# Patient Record
Sex: Male | Born: 1970 | Hispanic: No | Marital: Married | Smoking: Never smoker
Health system: Southern US, Community
[De-identification: ages and names within clinical notes are randomized; demographics above are authoritative.]

## PROBLEM LIST (undated history)

## (undated) DIAGNOSIS — K9049 Malabsorption due to intolerance, not elsewhere classified: Secondary | ICD-10-CM

## (undated) DIAGNOSIS — K219 Gastro-esophageal reflux disease without esophagitis: Secondary | ICD-10-CM

## (undated) DIAGNOSIS — K529 Noninfective gastroenteritis and colitis, unspecified: Secondary | ICD-10-CM

## (undated) DIAGNOSIS — Q760 Spina bifida occulta: Secondary | ICD-10-CM

## (undated) HISTORY — PX: OTHER SURGICAL HISTORY: SHX169

## (undated) HISTORY — DX: Noninfective gastroenteritis and colitis, unspecified: K52.9

## (undated) HISTORY — DX: Gastro-esophageal reflux disease without esophagitis: K21.9

## (undated) HISTORY — DX: Malabsorption due to intolerance, not elsewhere classified: K90.49

## (undated) HISTORY — DX: Spina bifida occulta: Q76.0

---

## 2013-01-29 ENCOUNTER — Other Ambulatory Visit: Payer: Self-pay | Admitting: Family Medicine

## 2013-01-29 DIAGNOSIS — R1031 Right lower quadrant pain: Secondary | ICD-10-CM

## 2013-01-31 ENCOUNTER — Other Ambulatory Visit: Payer: Self-pay | Admitting: Family Medicine

## 2013-01-31 DIAGNOSIS — R1031 Right lower quadrant pain: Secondary | ICD-10-CM

## 2013-02-01 ENCOUNTER — Ambulatory Visit
Admission: RE | Admit: 2013-02-01 | Discharge: 2013-02-01 | Disposition: A | Discharge status: Home / Self Care | Payer: 59 | Source: Ambulatory Visit | Attending: Family Medicine | Admitting: Family Medicine

## 2013-02-01 DIAGNOSIS — R1031 Right lower quadrant pain: Secondary | ICD-10-CM

## 2013-05-21 ENCOUNTER — Encounter: Payer: Self-pay | Admitting: Internal Medicine

## 2013-06-27 ENCOUNTER — Ambulatory Visit: Payer: 59 | Admitting: Internal Medicine

## 2013-07-30 ENCOUNTER — Encounter: Payer: Self-pay | Admitting: Gastroenterology

## 2013-08-07 ENCOUNTER — Encounter: Payer: Self-pay | Admitting: Gastroenterology

## 2013-08-07 ENCOUNTER — Ambulatory Visit (INDEPENDENT_AMBULATORY_CARE_PROVIDER_SITE_OTHER): Payer: 59 | Admitting: Gastroenterology

## 2013-08-07 VITALS — BP 110/70 | HR 80 | Ht 70.0 in | Wt 184.4 lb

## 2013-08-07 DIAGNOSIS — R1031 Right lower quadrant pain: Secondary | ICD-10-CM

## 2013-08-07 DIAGNOSIS — K219 Gastro-esophageal reflux disease without esophagitis: Secondary | ICD-10-CM

## 2013-08-07 NOTE — Progress Notes (Signed)
08/07/2013 Danny Reeves [id number] [birth year]   HISTORY OF PRESENT ILLNESS:  This is a 42 year old male who is new to our practice. He comes in today with several complaints and requests, but primarily is here for evaluation of his right lower quadrant abdominal pain. He says that the pain has been present since March or April and comes and goes but is present about every other day. It is described as a dull pain.  He says that it may increase after eating but does not seem to be affected by bowel movements. He has regular bowel movements with no blood. Gets occasional diarrhea if he eats greasy foods. He says that he was checked for lactose and fructose intolerance by his previous family doctor and those were both positive. His PCP ordered an ultrasound of the abdomen for evaluation of this pain back in April, which was negative. He then ordered a CT scan of the abdomen and pelvis, however, the patient did not have that study performed because he is concerned about the radiation. I mentioned to him again about CAT scan today, but he is adamant that we attempt to get an MRI instead.  He also complains of reflux type symptoms that have only been present for the past week. He is concerned about a hiatal hernia and does not want to have to take medication. He was advised that a hiatal hernia should show up on abdominal imaging.    Past Medical History  Diagnosis Date  . GERD (gastroesophageal reflux disease)   . Food intolerance     dairy,frutose   History reviewed. No pertinent past surgical history.  reports that he has never smoked. He has never used smokeless tobacco. He reports that he drinks alcohol. He reports that he does not use illicit drugs. family history includes Heart disease in his mother; Liver cancer in his father. No Known Allergies    No outpatient encounter prescriptions on file as of 08/07/2013.   No facility-administered encounter medications on file as of 08/07/2013.      REVIEW OF SYSTEMS  : All other systems reviewed and negative except where noted in the History of Present Illness.   PHYSICAL EXAM: BP 110/70  Pulse 80  Ht 5\' 10"  (1.778 m)  Wt 184 lb 6 oz (83.632 kg)  BMI 26.46 kg/m2 General: Well developed male in no acute distress Head: Normocephalic and atraumatic Eyes:  Sclerae anicteric, conjunctiva pink. Ears: Normal auditory acuity  Lungs: Clear throughout to auscultation Heart: Regular rate and rhythm Abdomen: Soft, nontender, nondistended.  Normal bowel sounds. Small reducible non-tender umbilical hernia noted.   Musculoskeletal: Symmetrical with no gross deformities  Skin: No lesions on visible extremities Extremities: No edema  Neurological: Alert oriented x 4, grossly nonfocal Psychological:  Alert and cooperative. Normal mood and affect  ASSESSMENT AND PLAN: -Right lower quadrant abdominal pain:  Unsure of the source of his pain. I suggested a CT scan since the pain has now been present for 7 month, but patient declined and is concerned about the radiation. He was very adamant that we order an MRI instead. I told him that if the MRI was not approved then CAT scan would be the next step. -Reflux:  Only present for approximately one week. Will try over-the-counter Zantac once or twice daily.

## 2013-08-07 NOTE — Patient Instructions (Signed)
You have been scheduled for an MRI at [facility] on 08/19/13. Your appointment time is 8:00 am. Please arrive 15 minutes prior to your appointment time for registration purposes. There is no prep for this test. However, if you have any metal in your body, have a pacemaker or defibrillator, please be sure to let your ordering physician know. This test typically takes 45 minutes to 1 hour to complete.

## 2013-08-08 NOTE — Progress Notes (Signed)
Reviewed and agree with management.  I would also check Hemoccults Apolonio Cutting D. Arlyce Dice, M.D., [facility]

## 2013-08-19 ENCOUNTER — Ambulatory Visit (HOSPITAL_COMMUNITY)
Admission: RE | Admit: 2013-08-19 | Discharge: 2013-08-19 | Disposition: A | Discharge status: Home / Self Care | Payer: 59 | Source: Ambulatory Visit | Attending: Gastroenterology | Admitting: Gastroenterology

## 2013-08-19 ENCOUNTER — Other Ambulatory Visit: Payer: Self-pay | Admitting: Gastroenterology

## 2013-08-19 ENCOUNTER — Ambulatory Visit (HOSPITAL_COMMUNITY): Admission: RE | Admit: 2013-08-19 | Payer: 59 | Source: Ambulatory Visit

## 2013-08-19 DIAGNOSIS — K5730 Diverticulosis of large intestine without perforation or abscess without bleeding: Secondary | ICD-10-CM | POA: Insufficient documentation

## 2013-08-19 DIAGNOSIS — R1031 Right lower quadrant pain: Secondary | ICD-10-CM

## 2013-08-19 MED ORDER — GADOBENATE DIMEGLUMINE 529 MG/ML IV SOLN
20.0000 mL | Freq: Once | INTRAVENOUS | Status: AC | PRN
  Administered 2013-08-19: 17 mL via INTRAVENOUS

## 2013-08-20 ENCOUNTER — Other Ambulatory Visit: Payer: Self-pay | Admitting: *Deleted

## 2013-08-20 DIAGNOSIS — R109 Unspecified abdominal pain: Secondary | ICD-10-CM

## 2013-08-27 ENCOUNTER — Ambulatory Visit: Payer: 59 | Admitting: Gastroenterology

## 2013-08-27 IMAGING — US US ABDOMEN COMPLETE
1 series · 14 of 25 positions shown · non-contrast
Comparison: None.

CLINICAL DATA: Right lower quadrant pain

COMPLETE ABDOMINAL ULTRASOUND

[Series 1: us abdomen complete · 0.33mm/px · 14 of 75 slices shown]
[im 1/75]
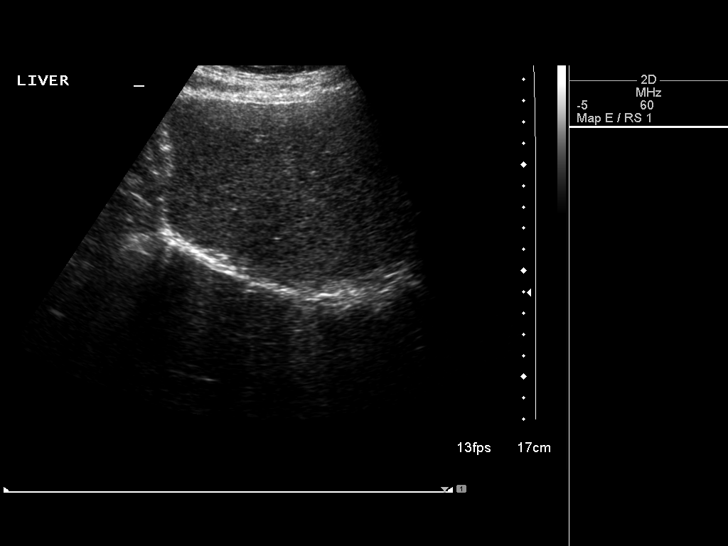
[im 7/75]
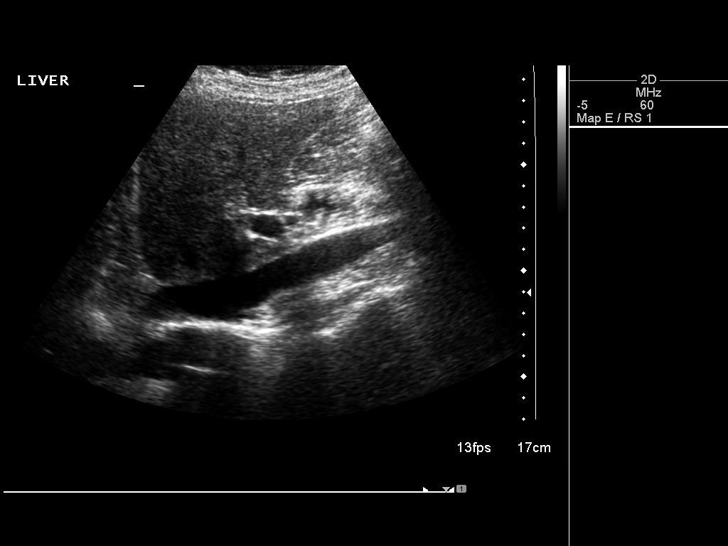
[im 13/75]
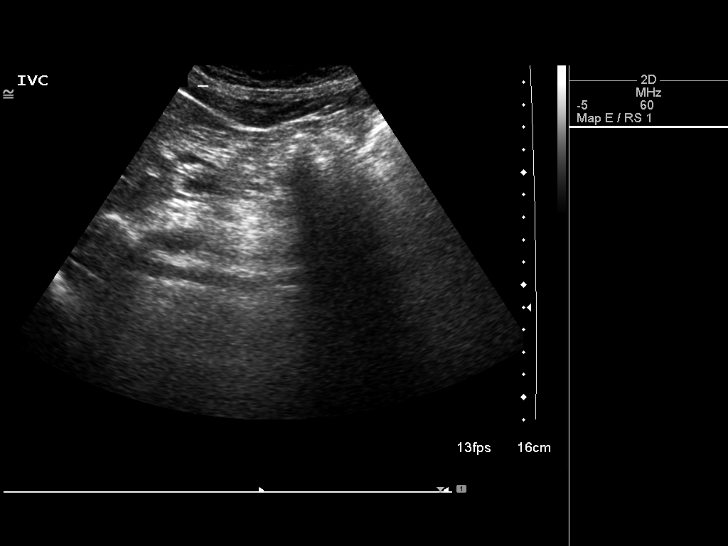
[im 19/75]
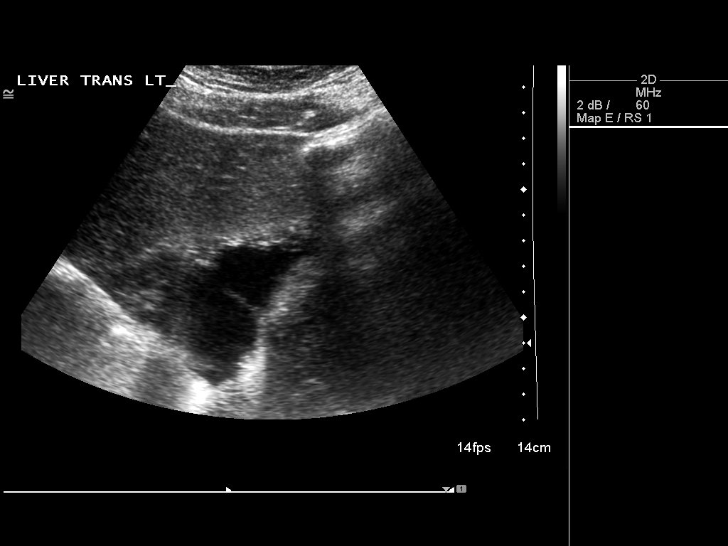
[im 25/75]
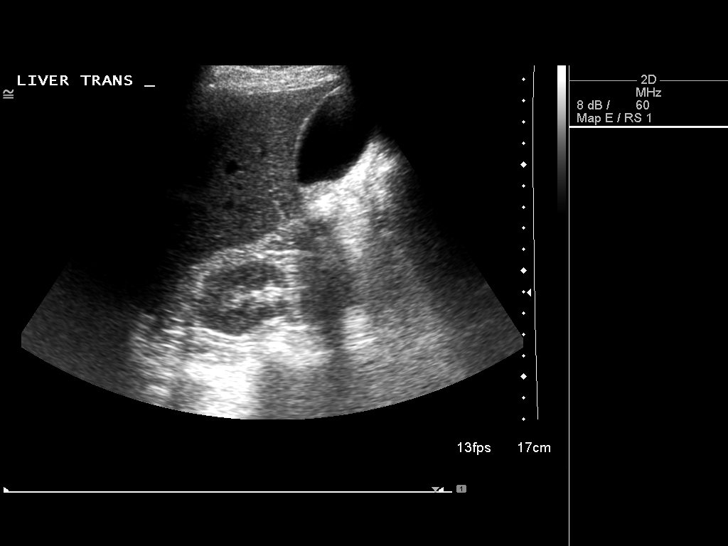
[im 28/75]
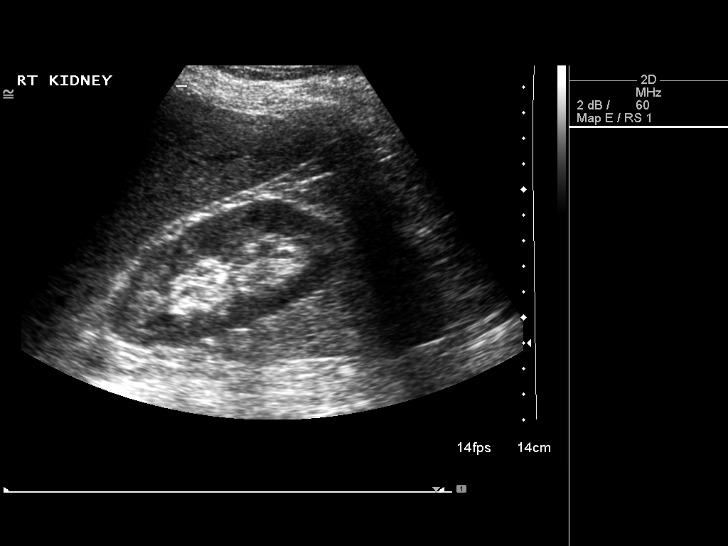
[im 34/75]
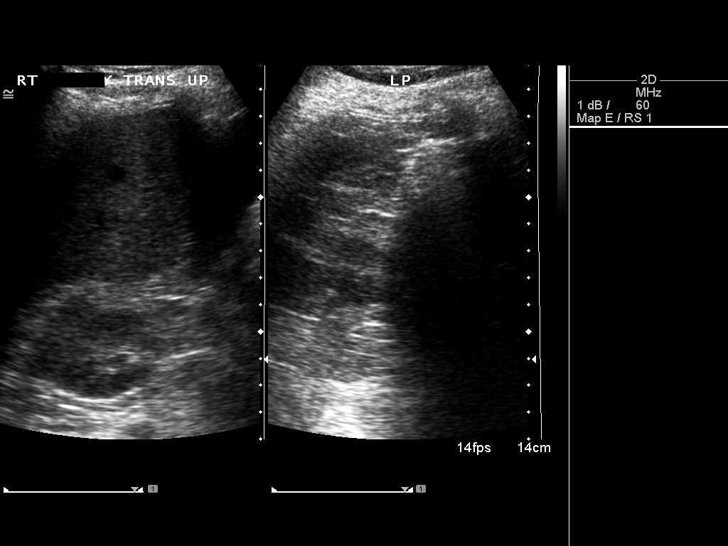
[im 41/75]
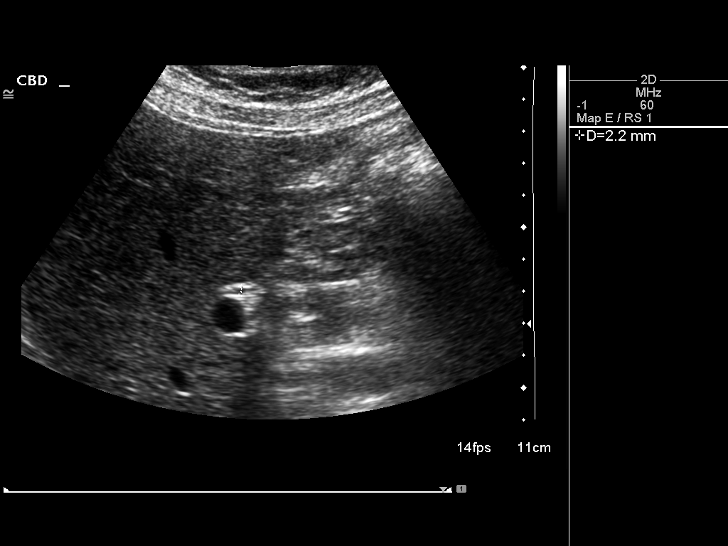
[im 47/75]
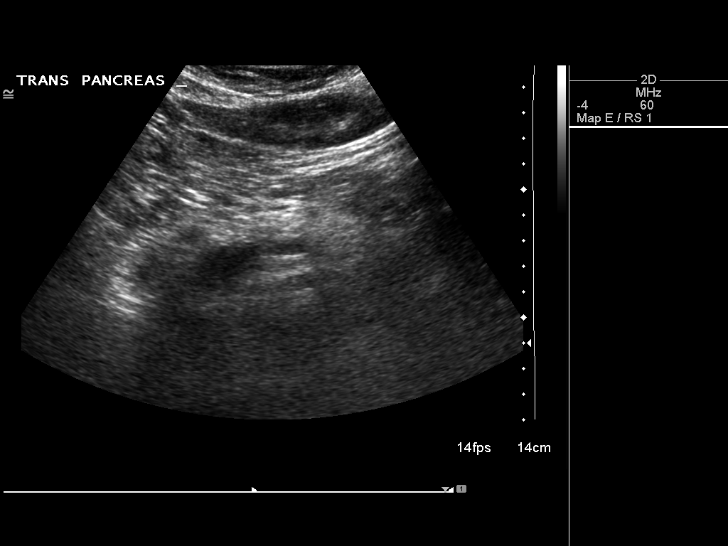
[im 50/75]
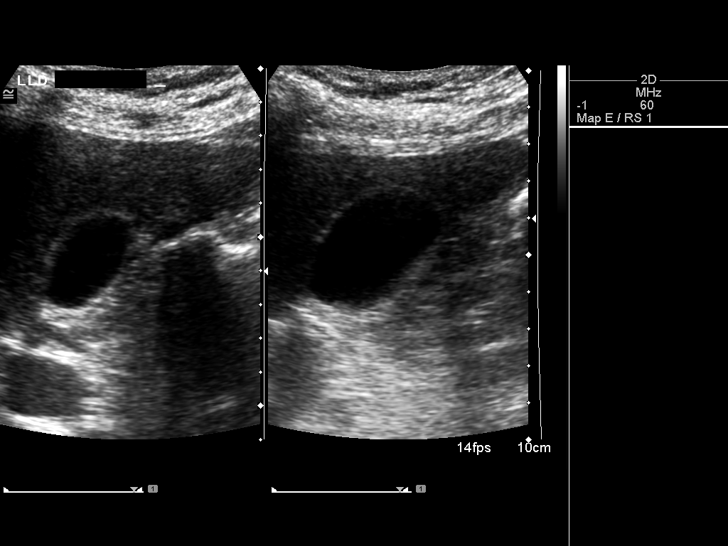
[im 56/75]
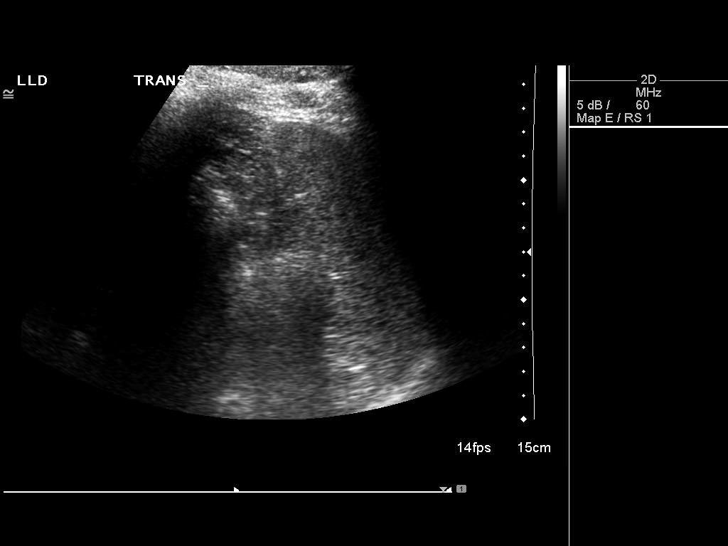
[im 62/75]
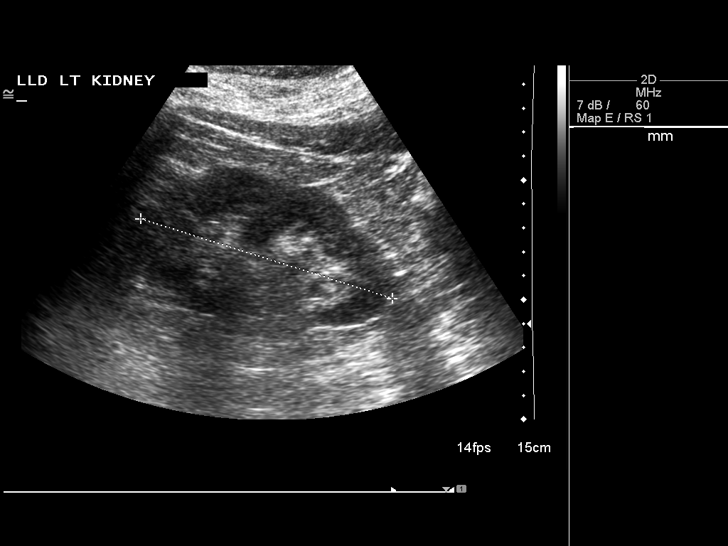
[im 68/75]
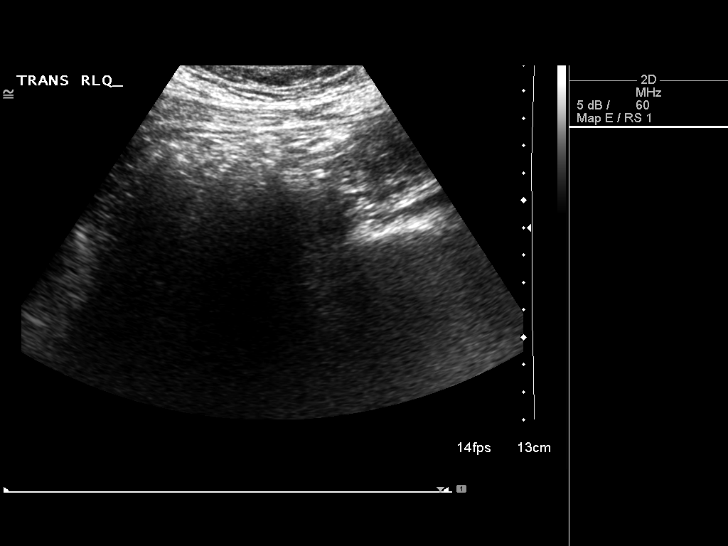
[im 75/75]
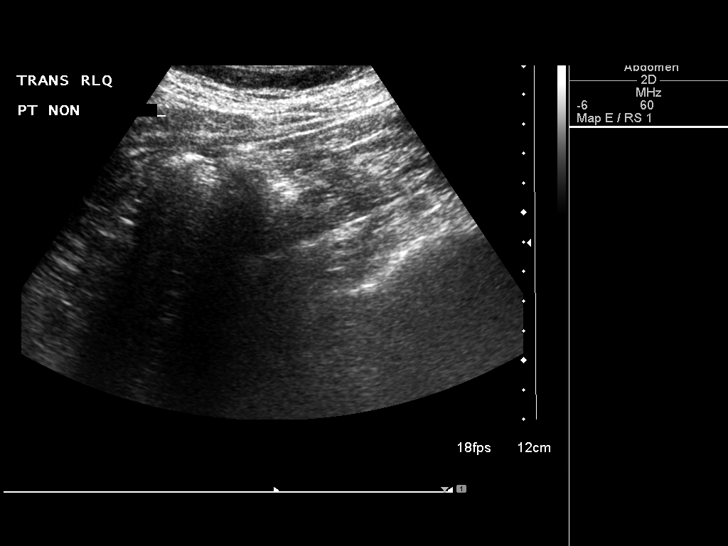

[14 of 25 positions shown; findings below may reference images not displayed]

FINDINGS: Gallbladder:  Bladder is visualized and no gallstones are noted.
There is no pain over the gallbladder with compression.

Common bile duct:  The common bile duct is normal measuring 2.2 mm
in diameter.

Liver:  The liver has a normal echogenic pattern.  No ductal
dilatation is seen.

IVC:  Appears normal.

Pancreas:  No focal abnormality seen.

Spleen:  The spleen is normal measuring 5.4 cm sagittally.

Right Kidney:  No hydronephrosis is seen.  The right kidney
measures 10.3 cm sagittally.

Left Kidney:  No hydronephrosis is noted.  The left kidney measures
11.0 cm.

Abdominal aorta:  The abdominal aorta is normal in caliber.

The evaluation of the appendix is not generally included in an
ultrasound of the abdomen.  There is a large amount of bowel gas
however in this patient's right lower quadrant and the appendix is
not visualized.
IMPRESSION: 1.  No gallstones.  No ductal dilatation.
2.  No hydronephrosis.
3.  Considerable bowel gas in the right lower quadrant obscuring
anatomic detail.

## 2013-09-18 ENCOUNTER — Telehealth: Payer: Self-pay | Admitting: *Deleted

## 2013-09-18 ENCOUNTER — Encounter: Payer: Self-pay | Admitting: *Deleted

## 2013-09-18 NOTE — Telephone Encounter (Signed)
Patient has not completed hemoccult cards as requested. Mailed a letter to patient requesting he do the testing or call us for new cards to be mailed to him.

## 2013-09-18 NOTE — Telephone Encounter (Signed)
Message copied by Daphine Deutscher on Wed Sep 18, 2013 11:02 AM ------      Message from: Daphine Deutscher      Created: Tue Aug 20, 2013 11:09 AM       Did patient do stool cards for JZ ------

## 2013-09-23 ENCOUNTER — Telehealth: Payer: Self-pay | Admitting: *Deleted

## 2013-09-23 NOTE — Telephone Encounter (Signed)
Patient states he found his stool cards and will do the test for [place]. He asked many questions about his MRI results and if it showed a hernia. Went over report multiple times with patient. He kept asking if it showed a hernia. Offered OV with Dr. Arlyce Dice to discuss but patient said he did not need an OV. He repeatedly asked for radiologist to read MRI again but refused to schedule OV to talk with Dr. Arlyce Dice.

## 2013-09-26 ENCOUNTER — Encounter (INDEPENDENT_AMBULATORY_CARE_PROVIDER_SITE_OTHER): Payer: Self-pay

## 2013-09-26 ENCOUNTER — Encounter (INDEPENDENT_AMBULATORY_CARE_PROVIDER_SITE_OTHER): Payer: Self-pay | Admitting: General Surgery

## 2013-09-26 ENCOUNTER — Ambulatory Visit (INDEPENDENT_AMBULATORY_CARE_PROVIDER_SITE_OTHER): Payer: 59 | Admitting: General Surgery

## 2013-09-26 VITALS — BP 126/84 | HR 70 | Temp 97.4°F | Resp 16 | Ht 70.0 in | Wt 189.2 lb

## 2013-09-26 DIAGNOSIS — R1031 Right lower quadrant pain: Secondary | ICD-10-CM

## 2013-09-26 NOTE — Progress Notes (Signed)
Subjective:     Patient ID: Danny Reeves, male   DOB: [birth year], 42 y.o.   MRN: [id number]  HPI The patient is a 42 year old male who is referred by Dr. Bosie Clos for an evaluation of a possible right inguinal hernia. Patient states he noticed some pain several weeks ago. He was concerned about a GI issue. He is worked up by Dr. Bosie Clos and states there is no issues GI wise he could have a hernia. The patient states the pain has gotten better.  The patient underwent a MRIscan which reveals no hernia. Patient was also concerned of possible hiatal hernia.  There was no hiatal hernia on MRIReview of Systems  Constitutional: Negative.   HENT: Negative.   Eyes: Negative.   Respiratory: Negative.   Cardiovascular: Negative.   Gastrointestinal: Negative.   Endocrine: Negative.   Neurological: Negative.        Objective:   Physical Exam  Constitutional: He is oriented to person, place, and time. He appears well-developed and well-nourished.  HENT:  Head: Normocephalic and atraumatic.  Eyes: Conjunctivae and EOM are normal. Pupils are equal, round, and reactive to light.  Neck: Normal range of motion. Neck supple.  Cardiovascular: Normal rate, regular rhythm and normal heart sounds.   Pulmonary/Chest: Effort normal and breath sounds normal.  Abdominal: Hernia confirmed negative in the right inguinal area and confirmed negative in the left inguinal area.    Musculoskeletal: Normal range of motion.  Neurological: He is alert and oriented to person, place, and time.  Skin: Skin is warm and dry.       Assessment:     42 year old male with a right groin muscle strain and an umbilical hernia     Plan:     1. I believe the most it would get better with time. 2. Patient has no pain at this time his umbilical hernia and the need for repair.  3. Patient followup as needed

## 2013-10-08 ENCOUNTER — Other Ambulatory Visit (INDEPENDENT_AMBULATORY_CARE_PROVIDER_SITE_OTHER): Payer: 59

## 2013-10-08 DIAGNOSIS — R109 Unspecified abdominal pain: Secondary | ICD-10-CM

## 2013-10-08 LAB — HEMOCCULT SLIDES (X 3 CARDS)
Fecal Occult Blood: NEGATIVE
OCCULT 5: NEGATIVE

## 2013-12-09 ENCOUNTER — Telehealth: Payer: Self-pay | Admitting: *Deleted

## 2013-12-09 NOTE — Telephone Encounter (Signed)
Returned call r/t wife and pt stated he called office about himself.  Stated he wants to see a doctor r/t him having cold feet and is a new patient.  Informed he will need to schedule the first available new pt appt and advised he see his PCP r/t the issue first.  Also c/o elevated SBP isn 140s and advised PCP can address BP issues, but if he wants to be seen he will need a new pt appt.  Verbalized understanding and transferred to scheduling to be seen.    S/s of DVT were reviewed w/ pt and he denied having them.

## 2013-12-23 ENCOUNTER — Emergency Department (HOSPITAL_COMMUNITY): Payer: 59

## 2013-12-23 ENCOUNTER — Encounter: Payer: Self-pay | Admitting: Neurology

## 2013-12-23 ENCOUNTER — Emergency Department (HOSPITAL_COMMUNITY)
Admission: EM | Admit: 2013-12-23 | Discharge: 2013-12-23 | Disposition: A | Discharge status: Home / Self Care | Payer: 59 | Attending: Emergency Medicine | Admitting: Emergency Medicine

## 2013-12-23 ENCOUNTER — Encounter (HOSPITAL_COMMUNITY): Payer: Self-pay | Admitting: Emergency Medicine

## 2013-12-23 DIAGNOSIS — M791 Myalgia, unspecified site: Secondary | ICD-10-CM

## 2013-12-23 DIAGNOSIS — M545 Low back pain, unspecified: Secondary | ICD-10-CM | POA: Insufficient documentation

## 2013-12-23 DIAGNOSIS — R0602 Shortness of breath: Secondary | ICD-10-CM

## 2013-12-23 DIAGNOSIS — I10 Essential (primary) hypertension: Secondary | ICD-10-CM | POA: Insufficient documentation

## 2013-12-23 DIAGNOSIS — R42 Dizziness and giddiness: Secondary | ICD-10-CM | POA: Insufficient documentation

## 2013-12-23 DIAGNOSIS — Z8719 Personal history of other diseases of the digestive system: Secondary | ICD-10-CM | POA: Insufficient documentation

## 2013-12-23 DIAGNOSIS — M542 Cervicalgia: Secondary | ICD-10-CM | POA: Insufficient documentation

## 2013-12-23 DIAGNOSIS — IMO0001 Reserved for inherently not codable concepts without codable children: Secondary | ICD-10-CM | POA: Insufficient documentation

## 2013-12-23 LAB — URINALYSIS, ROUTINE W REFLEX MICROSCOPIC
Bilirubin Urine: NEGATIVE
Glucose, UA: NEGATIVE mg/dL
HGB URINE DIPSTICK: NEGATIVE
KETONES UR: NEGATIVE mg/dL
Leukocytes, UA: NEGATIVE
NITRITE: NEGATIVE
PH: 6 (ref 5.0–8.0)
Protein, ur: NEGATIVE mg/dL
Specific Gravity, Urine: 1.015 (ref 1.005–1.030)
Urobilinogen, UA: 0.2 mg/dL (ref 0.0–1.0)

## 2013-12-23 LAB — CBC WITH DIFFERENTIAL/PLATELET
Basophils Absolute: 0 K/uL (ref 0.0–0.1)
Basophils Relative: 0 % (ref 0–1)
Eosinophils Absolute: 0.1 K/uL (ref 0.0–0.7)
Eosinophils Relative: 1 % (ref 0–5)
HCT: 42.2 % (ref 39.0–52.0)
Hemoglobin: 14.5 g/dL (ref 13.0–17.0)
Lymphocytes Relative: 22 % (ref 12–46)
Lymphs Abs: 1.7 10*3/uL (ref 0.7–4.0)
MCH: 30.2 pg (ref 26.0–34.0)
MCHC: 34.4 g/dL (ref 30.0–36.0)
MCV: 87.9 fL (ref 78.0–100.0)
Monocytes Absolute: 0.5 K/uL (ref 0.1–1.0)
Monocytes Relative: 6 % (ref 3–12)
Neutro Abs: 5.3 10*3/uL (ref 1.7–7.7)
Neutrophils Relative %: 71 % (ref 43–77)
Platelets: 225 10*3/uL (ref 150–400)
RBC: 4.8 MIL/uL (ref 4.22–5.81)
RDW: 12.5 % (ref 11.5–15.5)
WBC: 7.4 10*3/uL (ref 4.0–10.5)

## 2013-12-23 LAB — D-DIMER, QUANTITATIVE: D-Dimer, Quant: <0.27 ug/mL-FEU (ref 0.00–0.48)

## 2013-12-23 LAB — BASIC METABOLIC PANEL
BUN: 13 mg/dL (ref 6–23)
CO2: 28 mEq/L (ref 19–32)
Chloride: 100 mEq/L (ref 96–112)
GFR calc Af Amer: >90 mL/min (ref >90)
Glucose, Bld: 102 mg/dL — ABNORMAL HIGH (ref 70–99)
Potassium: 4.1 mEq/L (ref 3.7–5.3)
Sodium: 139 mEq/L (ref 137–147)

## 2013-12-23 LAB — BASIC METABOLIC PANEL WITH GFR
Calcium: 9.6 mg/dL (ref 8.4–10.5)
Creatinine, Ser: 0.9 mg/dL (ref 0.50–1.35)
GFR calc non Af Amer: >90 mL/min (ref >90)

## 2013-12-23 LAB — I-STAT TROPONIN, ED: Troponin i, poc: 0 ng/mL (ref 0.00–0.08)

## 2013-12-23 MED ORDER — SODIUM CHLORIDE 0.9 % IV BOLUS (SEPSIS)
1000.0000 mL | Freq: Once | INTRAVENOUS | Status: AC
  Administered 2013-12-23: 1000 mL via INTRAVENOUS

## 2013-12-23 NOTE — ED Notes (Signed)
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

## 2013-12-23 NOTE — ED Notes (Signed)
Pt presents to department for evaluation of SOB and head pain. Ongoing for several weeks. Now states symptoms have progressed. States he is more short of breath than usual. States "funny feeling in chest." pt is alert and oriented x4. No signs of acute distress noted.

## 2013-12-23 NOTE — ED Provider Notes (Signed)
CSN: [id number]     Arrival date & time 12/23/13  1332 History   First MD Initiated Contact with Patient 12/23/13 1601     Chief Complaint  Patient presents with  . Shortness of Breath  . Neck Pain  . Headache     (Consider location/radiation/quality/duration/timing/severity/associated sxs/prior Treatment) The history is provided by the patient and medical records. No language interpreter was used.    Danny Reeves is a 43 y.o. male  with a hx of GERD presents to the Emergency Department complaining of gradual, persistent, multiple complaints onset 11/28/12.  Pt reports pain in the bilateral calves and posterior thighs with intermittent cramps in his legs.  Pt also reports feeling "foggy headed" for 2 weeks.  He also reports HTN with systolic BP to 140 (normal systolic 120) measured at home.  Pt reports palpitations every morning x2 days and SOB x 2 weeks.  Pt denies any OTC medications to treat these symptoms.  He reports seeing his PCP about these symptoms on 12/16/13 without being prescribed anything. He also reports several weeks of "cold feet" for which his PCP referred him to neurology and he has an appointment in 2 days.  Pt also c/o low back pain for months that waxes and wanes.  Pt reports he was bitten by a tick last April but did not at any point have a rash or fevers.  Pt denies fever, chills, neck pain, neck stiffness, rash, chest pain, abdominal pain, nausea, vomiting, diarrhea, weakness, dizziness, syncope, dysuria, hematuria.  Patient denies personal or family cardiac history. He denies recent surgeries, history of cancer, history of clotting disorders, recent immobilization/travel, exogenous estrogen.   Past Medical History  Diagnosis Date  . GERD (gastroesophageal reflux disease)   . Food intolerance     dairy,frutose  . Colitis    Past Surgical History  Procedure Laterality Date  . Tonsilectomy     Family History  Problem Relation Age of Onset  . Liver cancer Father    . Cancer Father 45    liver cancer  . Heart disease Mother    History  Substance Use Topics  . Smoking status: Never Smoker   . Smokeless tobacco: Never Used  . Alcohol Use: Yes     Comment: beer every other day    Review of Systems  Constitutional: Negative for fever, diaphoresis, appetite change, fatigue and unexpected weight change.  HENT: Negative for mouth sores.   Eyes: Negative for visual disturbance.  Respiratory: Positive for shortness of breath. Negative for cough, chest tightness and wheezing.   Cardiovascular: Negative for chest pain.  Gastrointestinal: Negative for nausea, vomiting, abdominal pain, diarrhea and constipation.  Endocrine: Negative for polydipsia, polyphagia and polyuria.  Genitourinary: Negative for dysuria, urgency, frequency and hematuria.  Musculoskeletal: Positive for back pain (low) and myalgias. Negative for neck stiffness.  Skin: Negative for rash.  Allergic/Immunologic: Negative for immunocompromised state.  Neurological: Negative for syncope, light-headedness and headaches.       "foggy headed"  Hematological: Does not bruise/bleed easily.  Psychiatric/Behavioral: Negative for sleep disturbance. The patient is not nervous/anxious.       Allergies  Review of patient's allergies indicates no known allergies.  Home Medications   Current Outpatient Rx  Name  Route  Sig  Dispense  Refill  . Ascorbic Acid (VITAMIN C) 1000 MG tablet   Oral   Take 1,000 mg by mouth daily as needed (for vitamin).          Marland Kitchen FISH OIL-CANOLA  OIL-VIT D3 PO   Oral   Take 5 mLs by mouth daily as needed (for vitamin).           BP 148/89  Pulse 94  Temp(Src) 98 F (36.7 C) (Oral)  Resp 21  Ht 5\' 10"  (1.778 m)  Wt 180 lb (81.647 kg)  BMI 25.83 kg/m2  SpO2 98% Physical Exam  Nursing note and vitals reviewed. Constitutional: He is oriented to person, place, and time. He appears well-developed and well-nourished. No distress.  Awake, alert, nontoxic  appearance  HENT:  Head: Normocephalic and atraumatic.  Right Ear: Tympanic membrane, external ear and ear canal normal.  Left Ear: Tympanic membrane, external ear and ear canal normal.  Nose: Nose normal. No epistaxis. Right sinus exhibits no maxillary sinus tenderness and no frontal sinus tenderness. Left sinus exhibits no maxillary sinus tenderness and no frontal sinus tenderness.  Mouth/Throat: Uvula is midline, oropharynx is clear and moist and mucous membranes are normal. Mucous membranes are not pale, not dry and not cyanotic. No oral lesions. No uvula swelling. No oropharyngeal exudate, posterior oropharyngeal edema, posterior oropharyngeal erythema or tonsillar abscesses.  Clear and moist oropharynx without petechiae or ulcers  Eyes: Conjunctivae and EOM are normal. Pupils are equal, round, and reactive to light. No scleral icterus.  Neck: Normal range of motion, full passive range of motion without pain and phonation normal. Neck supple. No spinous process tenderness and no muscular tenderness present. No rigidity. Normal range of motion present. No Brudzinski's sign and no Kernig's sign noted.  No midline or paraspinal tenderness No nuchal rigidity No meningeal signs Normal phonation  Cardiovascular: Regular rhythm, normal heart sounds and intact distal pulses.   No murmur heard. Tachycardia noted only after I began assessing the patient  Pulmonary/Chest: Effort normal and breath sounds normal. No stridor. No respiratory distress. He has no wheezes. He has no rales. He exhibits no tenderness.  Clear and equal breath sounds No Rales, rhonchi or wheezes No chest tenderness No diminished breath sounds  Abdominal: Soft. Bowel sounds are normal. He exhibits no distension and no mass. There is no tenderness. There is no rebound and no guarding.  Abdomen soft and nontender No rigidity, peritoneal signs or guarding No distention  Musculoskeletal: Normal range of motion. He exhibits no  edema and no tenderness.  No peripheral edema Negative Homans sign No palpable cord No calf or thigh tenderness  Lymphadenopathy:    He has no cervical adenopathy.  Neurological: He is alert and oriented to person, place, and time. He has normal reflexes. No cranial nerve deficit. He exhibits normal muscle tone. Coordination normal.  Speech is clear and goal oriented, follows commands Cranial nerves III - XII without deficit, no facial droop Normal strength in upper and lower extremities bilaterally, strong and equal grip strength Sensation normal to light and sharp touch Moves extremities without ataxia, coordination intact Normal finger to nose and rapid alternating movements Neg romberg, no pronator drift Normal gait Normal heel-shin and balance   Skin: Skin is warm and dry. No rash noted. He is not diaphoretic.  No petechiae or purpura  Psychiatric: His behavior is normal. Judgment and thought content normal. His mood appears anxious.  Patient appears anxious, asking many questions and stating numerous concerns    ED Course  Procedures (including critical care time) Labs Review Labs Reviewed  BASIC METABOLIC PANEL - Abnormal; Notable for the following:    Glucose, Bld 102 (*)    All other components within normal limits  CBC WITH DIFFERENTIAL  URINALYSIS, ROUTINE W REFLEX MICROSCOPIC  D-DIMER, QUANTITATIVE  I-STAT TROPOININ, ED   Imaging Review Dg Chest 2 View  12/23/2013   CLINICAL DATA:  Shortness of breath, headache, chest pain  EXAM: CHEST  2 VIEW  COMPARISON:  None  FINDINGS: Normal heart size, mediastinal contours, and pulmonary vascularity.  Minimal linear scarring or subsegmental atelectasis at lingula.  Lungs otherwise clear.  No pleural effusion or pneumothorax.  Bones unremarkable.  IMPRESSION: Minimal linear atelectasis versus scarring at lingula.   Electronically Signed   By: Ulyses Southward M.D.   On: 12/23/2013 14:18     EKG Interpretation   Date/Time:   Monday December 23 2013 13:38:20 EDT Ventricular Rate:  99 PR Interval:  126 QRS Duration: 86 QT Interval:  340 QTC Calculation: 436 R Axis:   70 Text Interpretation:  Normal sinus rhythm Normal ECG No previous tracing  Confirmed by [facility]  MD, MICHEAL ([id number]) on 12/23/2013 5:18:45 PM      MDM   Final diagnoses:  SOB (shortness of breath)  Myalgia   Danny Reeves presents with multiple complaints.  He has had many of these complaints for greater than one month. He has been evaluated by his primary care doctor for these complaints without new findings. Patient sees [facility].   On exam he is visibly anxious about his symptoms.  He has a normal neurologic exam, no focal neurologic deficits and no other abnormal physical finding.  Lung sounds are clear and equal without wheezing. No evidence of pneumonia on chest x-ray.  I personally reviewed the imaging tests through PACS system  I reviewed available ER/hospitalization records through the EMR  Patient's labs are unremarkable and reassuring.  UA without evidence of urinary tract infection, d-dimer negative, troponin negative, CBC and BMP unremarkable.  EKG with normal sinus rhythm, no evidence of ischemia. He denies personal or family cardiac history.  Discussed these findings with the patient. He remains concerned about Lyme disease, cardiac arrhythmias and blood clots.  He is PERC negative with a normal d-dimer.  There are no physical findings of Lyme disease and I recommend this test as an outpatient.  No evidence of arrhythmia during patient's stay here in the emergency department and normal EKG.  He has requested a Holter monitor and I have discussed with him that this will not be placed in the emergency department.  Patient is visibly anxious throughout his time here in the emergency department.  He is not tachycardic when alone in the room becomes tachycardic during my assessment. This happened several times for his time in the  emergency department.  I believe that his tachycardia and hypertension are likely secondary to anxiety. I have discussed this with him as well and recommended a primary care followup for this.  It has been determined that no acute conditions requiring further emergency intervention are present at this time. The patient/guardian have been advised of the diagnosis and plan. We have discussed signs and symptoms that warrant return to the ED, such as changes or worsening in symptoms.   Vital signs are stable at discharge.   BP 148/89  Pulse 94  Temp(Src) 98 F (36.7 C) (Oral)  Resp 21  Ht 5\' 10"  (1.778 m)  Wt 180 lb (81.647 kg)  BMI 25.83 kg/m2  SpO2 98%  Patient/guardian has voiced understanding and agreed to follow-up with the PCP or specialist.      Dierdre Forth, PA-C 12/23/13 1836

## 2013-12-23 NOTE — Discharge Instructions (Signed)
1. Medications: usual home medications °2. Treatment: rest, drink plenty of fluids,  °3. Follow Up: Please followup with your primary doctor for discussion of your diagnoses and further evaluation after today's visit; ° ° ° °

## 2013-12-23 NOTE — ED Provider Notes (Signed)
Medical screening examination/treatment/procedure(s) were performed by non-physician practitioner and as supervising physician I was immediately available for consultation/collaboration.   EKG Interpretation   Date/Time:  Monday December 23 2013 13:38:20 EDT Ventricular Rate:  99 PR Interval:  126 QRS Duration: 86 QT Interval:  340 QTC Calculation: 436 R Axis:   70 Text Interpretation:  Normal sinus rhythm Normal ECG No previous tracing  Confirmed by [facility]  MD, MICHEAL ([id number]) on 12/23/2013 5:18:45 PM        Gavin PoundMichael Y. Oletta LamasGhim, MD 12/23/13 [id number]

## 2013-12-23 NOTE — ED Notes (Addendum)
Pt states that about a month ago he started having pain in his foot that gradually lead up to his calf, then thigh. Pt states that about a week after those symptoms started that he started feeling SOB and felt like his heart was beating fast at times. Pt reports his BP would run high at times, most recently reports "150's/100's" and normally his BP runs "120's/70's." Pt also reports his heart rate is normally in the "70's" and that recently it has been "90's-100." Pt reports being bit by a tick "about a year ago" and he is afraid he has "Lymes Disease."

## 2013-12-25 ENCOUNTER — Encounter: Payer: Self-pay | Admitting: Neurology

## 2013-12-25 ENCOUNTER — Ambulatory Visit (INDEPENDENT_AMBULATORY_CARE_PROVIDER_SITE_OTHER): Payer: 59 | Admitting: Neurology

## 2013-12-25 VITALS — BP 126/82 | HR 89 | Ht 70.0 in | Wt 185.0 lb

## 2013-12-25 DIAGNOSIS — R209 Unspecified disturbances of skin sensation: Secondary | ICD-10-CM | POA: Insufficient documentation

## 2013-12-25 DIAGNOSIS — M79609 Pain in unspecified limb: Secondary | ICD-10-CM | POA: Insufficient documentation

## 2013-12-25 NOTE — Progress Notes (Signed)
Reason for visit: Numbness and pain in the legs  Danny Reeves is a 43 y.o. male  History of present illness:  Danny Reeves is a 43 year old right-handed gentleman with a history of mobile somatic complaints. The patient indicated that on 11/28/2013 he began to have some pain in the balls of the feet bilaterally. This was also associated with a cold sensation from the ankles down. The patient has had some discomfort also behind the knees, and into the posterior thighs. The patient has some low back pain as well, mainly associated with right flank pain. The patient indicates that the discomfort and slight numbness and tingling sensations in the legs will come and go, and it is not there all of the time. Over time, the patient believes that has been some improvement in his symptoms. The patient goes on to offer a multitude of other symptoms including episodes of tachycardia and shortness of breath. The patient was seen through the emergency room without any definitive abnormalities found. The patient reports muscle twitches in the arms and legs. The patient reports tremors at times, feeling of fatigue, headache on the top of the head that occurs 2 or 3 times a week, lasting about an hour at a time. The patient also reports difficulty with blurred vision and difficulty focusing. The patient has neck pain off and on. The patient is sent to this office for an evaluation. A B12 level and a thyroid profile has been done and is unremarkable.  Past Medical History  Diagnosis Date  . GERD (gastroesophageal reflux disease)   . Food intolerance     dairy,frutose  . Colitis     Past Surgical History  Procedure Laterality Date  . Tonsilectomy      Family History  Problem Relation Age of Onset  . Liver cancer Father   . Cancer Father [id number]    liver cancer  . Heart disease Mother     Social history:  reports that he has never smoked. He has never used smokeless tobacco. He reports that he drinks  alcohol. He reports that he does not use illicit drugs.  Medications:  Current Outpatient Prescriptions on File Prior to Visit  Medication Sig Dispense Refill  . Ascorbic Acid (VITAMIN C) 1000 MG tablet Take 1,000 mg by mouth daily as needed (for vitamin).       Marland Kitchen. FISH OIL-CANOLA OIL-VIT D3 PO Take 5 mLs by mouth daily as needed (for vitamin).        No current facility-administered medications on file prior to visit.     No Known Allergies  ROS:  Out of a complete 14 system review of symptoms, the patient complains only of the following symptoms, and all other reviewed systems are negative.  Numbness in the legs, pain in legs Headache Increased heart rate, short of breath Abdominal pain, right flank pain Muscle twitches, tremor Fatigue  Blood pressure 126/82, pulse 89, height 5\' 10"  (1.778 m), weight 185 lb (83.915 kg).  Physical Exam  General: The patient is alert and cooperative at the time of the examination.  Eyes: Pupils are equal, round, and reactive to light. Discs are flat bilaterally.  Neck: The neck is supple, no carotid bruits are noted.  Respiratory: The respiratory examination is clear.  Cardiovascular: The cardiovascular examination reveals a regular rate and rhythm, no obvious murmurs or rubs are noted.  Neuromuscular: The patient has excellent range of motion of the cervical and lumbosacral spines.  Skin: Extremities are without significant  edema.  Neurologic Exam  Mental status: The patient is alert and oriented x 3 at the time of the examination. The patient has apparent normal recent and remote memory, with an apparently normal attention span and concentration ability.  Cranial nerves: Facial symmetry is present. There is good sensation of the face to pinprick and soft touch bilaterally. The strength of the facial muscles and the muscles to head turning and shoulder shrug are normal bilaterally. Speech is well enunciated, no aphasia or dysarthria is  noted. Extraocular movements are full. Visual fields are full. The tongue is midline, and the patient has symmetric elevation of the soft palate. No obvious hearing deficits are noted.  Motor: The motor testing reveals 5 over 5 strength of all 4 extremities. Good symmetric motor tone is noted throughout.  Sensory: Sensory testing is intact to pinprick, soft touch, vibration sensation, and position sense on all 4 extremities. No evidence of extinction is noted.  Coordination: Cerebellar testing reveals good finger-nose-finger and heel-to-shin bilaterally.  Gait and station: Gait is normal. Tandem gait is normal. Romberg is negative. No drift is seen.  Reflexes: Deep tendon reflexes are symmetric and normal bilaterally. Toes are downgoing bilaterally.   Assessment/Plan:  1. Leg numbness, cold sensations, pain  2. Shortness of breath, tachycardia  3. Headache  4. Multiple somatic complaints  The clinical examination today was unremarkable. 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 The patient will undergo a neurologic workup to exclude other etiologies for his symptoms. The patient will undergo blood work evaluation today, and he will have nerve conduction studies of both legs, EMG evaluation of both legs. The patient will be considered for MRI evaluation of the brain if the studies are unremarkable to exclude demyelinating disease. The patient will followup for the EMG study.  Marlan Palau MD 12/25/2013 11:30 AM  [facility] [address] [place], [place] [address]  Phone [phone] Fax [phone]

## 2013-12-26 ENCOUNTER — Ambulatory Visit: Payer: 59 | Admitting: Cardiovascular Disease

## 2013-12-27 ENCOUNTER — Ambulatory Visit: Payer: 59 | Admitting: Cardiovascular Disease

## 2013-12-30 ENCOUNTER — Telehealth: Payer: Self-pay | Admitting: Neurology

## 2013-12-30 LAB — ANGIOTENSIN CONVERTING ENZYME: Angio Convert Enzyme: <14 U/L — ABNORMAL LOW (ref 14–82)

## 2013-12-30 LAB — CRYOGLOBULIN, QL, SERUM, RFLX

## 2013-12-30 LAB — ANA W/REFLEX: Anti Nuclear Antibody(ANA): NEGATIVE

## 2013-12-30 LAB — SEDIMENTATION RATE: Sed Rate: 3 mm/h (ref 0–15)

## 2013-12-30 LAB — LYME, TOTAL AB TEST/REFLEX: Lyme IgG/IgM Ab: <0.91 {ISR} (ref 0.00–0.90)

## 2013-12-30 LAB — RHEUMATOID FACTOR: Rheumatoid fact SerPl-aCnc: 8.7 [IU]/mL (ref 0.0–13.9)

## 2013-12-30 LAB — COPPER, SERUM: Copper: 90 ug/dL (ref 72–166)

## 2013-12-30 NOTE — Telephone Encounter (Signed)
Pt called and would like to know the results of the labs that were done last week.  Please call.  Thank you

## 2013-12-30 NOTE — Telephone Encounter (Signed)
Patient calling about lab work results, he is very anxious about them. Please call patient as soon as possible.

## 2013-12-30 NOTE — Telephone Encounter (Signed)
Patient is calling requesting lab work results. Please advise

## 2013-12-30 NOTE — Telephone Encounter (Signed)
I called patient. The blood work was unremarkable. The patient is to have an EMG and nerve conduction study done. If this is unremarkable, we will check MRI evaluation of the brain.

## 2013-12-31 ENCOUNTER — Telehealth: Payer: Self-pay | Admitting: *Deleted

## 2013-12-31 MED ORDER — CYCLOBENZAPRINE HCL 5 MG PO TABS: 5.0000 mg | ORAL_TABLET | Freq: Every day | ORAL | Status: DC

## 2013-12-31 NOTE — Telephone Encounter (Signed)
I called patient. The patient indicates that over the last 7-10 days he has had some discomfort in his neck when he lays down. The neck feels tense or tight. When he is up and doing things during the day, he feels normal. I will try a two-week course of low-dose Flexeril at nighttime.

## 2013-12-31 NOTE — Telephone Encounter (Signed)
Patient states that his neck is tense when he lays down on one side and trying to sleep.  Patient states that he does not breathe very well and wants to know if a MRI or xray can be ordered to see what is going on.  Patient feels that something is not quite right.  Please advise.

## 2013-12-31 NOTE — Addendum Note (Signed)
Addended by: Stephanie AcreWILLIS, Leira Regino on: 12/31/2013 01:50 PM   Modules accepted: Orders

## 2014-01-06 ENCOUNTER — Ambulatory Visit (INDEPENDENT_AMBULATORY_CARE_PROVIDER_SITE_OTHER): Payer: 59 | Admitting: Neurology

## 2014-01-06 ENCOUNTER — Encounter: Payer: Self-pay | Admitting: Neurology

## 2014-01-06 ENCOUNTER — Encounter (INDEPENDENT_AMBULATORY_CARE_PROVIDER_SITE_OTHER): Payer: Self-pay

## 2014-01-06 DIAGNOSIS — R209 Unspecified disturbances of skin sensation: Secondary | ICD-10-CM

## 2014-01-06 DIAGNOSIS — M79609 Pain in unspecified limb: Secondary | ICD-10-CM

## 2014-01-06 DIAGNOSIS — Z0289 Encounter for other administrative examinations: Secondary | ICD-10-CM

## 2014-01-06 NOTE — Progress Notes (Signed)
Danny Reeves is a 43 year old gentleman with a history of sensory alteration in the lower extremities that he claims is intermittent. The patient will have some discomfort in the right eye as well. The patient is being evaluated for a possible peripheral neuropathy or a lumbosacral radiculopathy. The patient will have occasional stiffness of the low back, and he gives a history of spina bifida occulta.  Nerve conduction studies done on the right arm and both legs were unremarkable, without evidence of a peripheral neuropathy. EMG evaluation of both lower extremities were unremarkable, without evidence of a lumbosacral radiculopathy.  The patient has an unremarkable examination today, he will be set up for MRI evaluation of the brain to evaluate for possible demyelinating disease. The patient likely has underlying anxiety disorder that is manifesting the somatic sensations. If the above evaluation is unremarkable, I would not pursue further neurologic testing.

## 2014-01-06 NOTE — Procedures (Signed)
     HISTORY:  Danny Reeves is a 43 year old gentleman with a history of intermittent sensory alterations involving the legs, along with some discomfort in the right thigh area. The patient being evaluated for these complaints, to rule out a possible peripheral neuropathy or a lumbosacral radiculopathy.  NERVE CONDUCTION STUDIES:  Nerve conduction studies were performed on the right upper extremity. The distal motor latency for the right median nerve was within normal limits, with a normal motor amplitude and a normal nerve conduction velocity for this nerve. The F wave latencies for the right median nerve was normal. The sensory latency for the right median nerve was normal.  Nerve conduction studies were performed on both lower extremities. The distal motor latencies and motor amplitudes for the peroneal and posterior tibial nerves were within normal limits. The nerve conduction velocities for these nerves were also normal. The H reflex latencies were normal. The sensory latencies for the peroneal nerves were within normal limits.   EMG STUDIES:  EMG study was performed on the right lower extremity:  The tibialis anterior muscle reveals 2 to 4K motor units with full recruitment. No fibrillations or positive waves were seen. The peroneus tertius muscle reveals 2 to 4K motor units with full recruitment. No fibrillations or positive waves were seen. The medial gastrocnemius muscle reveals 1 to 3K motor units with full recruitment. No fibrillations or positive waves were seen. The vastus lateralis muscle reveals 2 to 4K motor units with full recruitment. No fibrillations or positive waves were seen. The iliopsoas muscle reveals 2 to 4K motor units with full recruitment. No fibrillations or positive waves were seen. The biceps femoris muscle (long head) reveals 2 to 4K motor units with full recruitment. No fibrillations or positive waves were seen. The lumbosacral paraspinal muscles were  tested at 3 levels, and revealed no abnormalities of insertional activity at all 3 levels tested. There was good relaxation.  EMG study was performed on the left lower extremity:  The tibialis anterior muscle reveals 2 to 4K motor units with full recruitment. No fibrillations or positive waves were seen. The peroneus tertius muscle reveals 2 to 4K motor units with full recruitment. No fibrillations or positive waves were seen. The medial gastrocnemius muscle reveals 1 to 3K motor units with full recruitment. No fibrillations or positive waves were seen. The vastus lateralis muscle reveals 2 to 4K motor units with full recruitment. No fibrillations or positive waves were seen. The iliopsoas muscle reveals 2 to 4K motor units with full recruitment. No fibrillations or positive waves were seen. The biceps femoris muscle (long head) reveals 2 to 4K motor units with full recruitment. No fibrillations or positive waves were seen. The lumbosacral paraspinal muscles were tested at 3 levels, and revealed no abnormalities of insertional activity at all 3 levels tested. There was good relaxation.   IMPRESSION:  Nerve conduction studies done on the right upper extremity and both lower extremities were within normal limits. No evidence of a peripheral neuropathy is seen. EMG evaluation of both lower extremities were unremarkable, without evidence of an overlying lumbosacral radiculopathy on either side.  Marlan Palau. Keith Willis MD 01/06/2014 10:52 AM  [facility] [address] [place], [place] [id number]  Phone [phone] Fax [phone]

## 2014-01-07 ENCOUNTER — Telehealth: Payer: Self-pay | Admitting: Neurology

## 2014-01-07 DIAGNOSIS — Z0289 Encounter for other administrative examinations: Secondary | ICD-10-CM

## 2014-01-07 NOTE — Telephone Encounter (Signed)
Patient wants to know if a test can do done to check and see that blood is flowing properly to his feet, please advise.

## 2014-01-07 NOTE — Telephone Encounter (Signed)
I called patient. The patient wants to know if the circulation issues can cause the numbness. I have indicated that we will check the MRI the brain first, and I'll get back to him concerning this result.

## 2014-01-07 NOTE — Telephone Encounter (Signed)
Patient calling to ask whether there is a test that can be done to see if blood is flowing properly to his feet. Please call and advise patient.

## 2014-01-08 ENCOUNTER — Encounter: Payer: Self-pay | Admitting: Neurology

## 2014-01-09 ENCOUNTER — Ambulatory Visit (INDEPENDENT_AMBULATORY_CARE_PROVIDER_SITE_OTHER): Payer: 59

## 2014-01-09 ENCOUNTER — Telehealth: Payer: Self-pay | Admitting: Neurology

## 2014-01-09 DIAGNOSIS — R209 Unspecified disturbances of skin sensation: Secondary | ICD-10-CM

## 2014-01-09 NOTE — Telephone Encounter (Signed)
I discussed this issue with the patient in person. We will get MRI of the neck, this will visualize the upper part of the cervical spine. Basically, this patient wants to be scanned on his entire body. If the MRI the brain is unremarkable, no further neurologic workup will be done.

## 2014-01-09 NOTE — Telephone Encounter (Signed)
Patient is already having his MRI today, explained that the request has to be sent to the physician and his insurance company has to pre approve, he verbalized understanding and said that he was already here in waiting room but would like for Dr Anne HahnWillis to add another order  for MRI neck

## 2014-01-09 NOTE — Telephone Encounter (Signed)
Pt has an MRI today at 3:30 wants to know if Dr. Anne HahnWillis can add to scan his neck as well. Advised pt I didn't know if we could get the message through fast enough he understood.

## 2014-01-10 ENCOUNTER — Telehealth: Payer: Self-pay | Admitting: Neurology

## 2014-01-10 NOTE — Telephone Encounter (Signed)
I called patient. MRI of the brain shows 2 tiny punctate white matter lesions, nonspecific. There is a mucous retention cyst in the right maxillary sinus. Nothing that looks typical for multiple sclerosis.   MRI brain 01/09/2014:  IMPRESSION: Abnormal MRI brain secondary to two tiny nonspecific bifrontal white matter hyperintensities with differential discussed above. Incidental large right maxillary sinus mucus retention cyst/polyp is noted.

## 2014-01-14 ENCOUNTER — Ambulatory Visit (INDEPENDENT_AMBULATORY_CARE_PROVIDER_SITE_OTHER): Payer: 59 | Admitting: Cardiovascular Disease

## 2014-01-14 ENCOUNTER — Encounter: Payer: Self-pay | Admitting: Cardiovascular Disease

## 2014-01-14 VITALS — BP 120/68 | HR 80 | Ht 70.0 in | Wt 185.8 lb

## 2014-01-14 DIAGNOSIS — M62838 Other muscle spasm: Secondary | ICD-10-CM

## 2014-01-14 DIAGNOSIS — R238 Other skin changes: Secondary | ICD-10-CM

## 2014-01-14 DIAGNOSIS — Z79899 Other long term (current) drug therapy: Secondary | ICD-10-CM

## 2014-01-14 DIAGNOSIS — R209 Unspecified disturbances of skin sensation: Secondary | ICD-10-CM

## 2014-01-14 DIAGNOSIS — R0602 Shortness of breath: Secondary | ICD-10-CM

## 2014-01-14 DIAGNOSIS — R0989 Other specified symptoms and signs involving the circulatory and respiratory systems: Secondary | ICD-10-CM

## 2014-01-14 DIAGNOSIS — R252 Cramp and spasm: Secondary | ICD-10-CM

## 2014-01-14 DIAGNOSIS — R0609 Other forms of dyspnea: Secondary | ICD-10-CM

## 2014-01-14 DIAGNOSIS — R0600 Dyspnea, unspecified: Secondary | ICD-10-CM

## 2014-01-14 LAB — COMPREHENSIVE METABOLIC PANEL
ALK PHOS: 58 U/L (ref 39–117)
ALT: 13 U/L (ref 0–53)
AST: 15 U/L (ref 0–37)
Albumin: 4.7 g/dL (ref 3.5–5.2)
BUN: 12 mg/dL (ref 6–23)
CO2: 30 mEq/L (ref 19–32)
CREATININE: 0.85 mg/dL (ref 0.50–1.35)
Calcium: 10 mg/dL (ref 8.4–10.5)
Chloride: 102 mEq/L (ref 96–112)
GLUCOSE: 89 mg/dL (ref 70–99)
POTASSIUM: 4.1 meq/L (ref 3.5–5.3)
Sodium: 142 mEq/L (ref 135–145)
Total Bilirubin: 0.5 mg/dL (ref 0.2–1.2)
Total Protein: 7.4 g/dL (ref 6.0–8.3)

## 2014-01-14 LAB — MAGNESIUM: Magnesium: 1.9 mg/dL (ref 1.5–2.5)

## 2014-01-14 NOTE — Patient Instructions (Signed)
Your physician recommends that you return for lab work in: [facility] today.  Your physician has recommended that you have a pulmonary function test. Pulmonary Function Tests are a group of tests that measure how well air moves in and out of your lungs.  Your physician has requested that you have a lower or upper extremity venous duplex. This test is an ultrasound of the veins in the legs or arms. It looks at venous blood flow that carries blood from the heart to the legs or arms. Allow one hour for a Lower Venous exam. Allow thirty minutes for an Upper Venous exam. There are no restrictions or special instructions.   Once Dr. Royann Shiversroitoru has review your tests you will be called with the results.  You do not need to make a follow-up appointment at this time.

## 2014-01-17 ENCOUNTER — Telehealth (HOSPITAL_COMMUNITY): Payer: Self-pay | Admitting: *Deleted

## 2014-02-02 DIAGNOSIS — R0600 Dyspnea, unspecified: Secondary | ICD-10-CM | POA: Insufficient documentation

## 2014-02-02 DIAGNOSIS — R209 Unspecified disturbances of skin sensation: Secondary | ICD-10-CM | POA: Insufficient documentation

## 2014-02-02 DIAGNOSIS — M62838 Other muscle spasm: Secondary | ICD-10-CM | POA: Insufficient documentation

## 2014-02-02 NOTE — Progress Notes (Signed)
Patient ID: Danny Reeves, male   DOB: [birth year], 43 y.o.   MRN: [id number]      Reason for office visit Cold feet, rapid heartbeat, dyspnea  This is Danny Reeves's first appointment with a cardiologist in [place]. He has had a variety of studies [place]. His wife is also recently become a patient here.  He has a variety of complaints. Since February he has noticed that his feet are always cold and quite uncomfortable. He has a very hard time adjusting to cold temperatures. He feels like he is having muscle tremors in his arms and legs and that he is not as strong as he used to be. He has less stamina and become short of breath when he tries to keep up with his children. He thinks this is more than deconditioning, but his lifestyle is extremely sedentary. His work keeps my desk all day long. Sometimes he feels short of breath even when he is not active. He is concerned about prominent veins in his lower extremities.  His dyspnea on exertion is not a consistent phenomenon. He definitely does not have orthopnea or PND and has not been troubled by lower extremity edema, syncope or anginal chest pain.  Vital signs are quite normal today but when he was recently in his primary care physician's office his heart rate was 100 beats per minute and his blood pressure was 145/100 mm Hg. He feels that his heart frequently beats too fast. He does not have the typical abrupt onset and abrupt termination of true arrhythmia.  He brings records from previous evaluation that included a normal echocardiogram and stress test.   No Known Allergies  Current Outpatient Prescriptions  Medication Sig Dispense Refill  . Ascorbic Acid (VITAMIN C) 1000 MG tablet Take 1,000 mg by mouth daily as needed (for vitamin).       Marland Kitchen aspirin EC 81 MG tablet Take 81 mg by mouth as needed.      . cyclobenzaprine (FLEXERIL) 5 MG tablet Take 5 mg by mouth 3 (three) times daily as needed.      Marland Kitchen FISH  OIL-CANOLA OIL-VIT D3 PO Take 5 mLs by mouth daily as needed (for vitamin).        No current facility-administered medications for this visit.    Past Medical History  Diagnosis Date  . GERD (gastroesophageal reflux disease)   . Food intolerance     dairy,frutose  . Colitis   . Spina bifida occulta     Past Surgical History  Procedure Laterality Date  . Tonsilectomy      Family History  Problem Relation Age of Onset  . Liver cancer Father   . Cancer Father 62    liver cancer  . Heart disease Mother     History   Social History  . Marital Status: Married    Spouse Name: N/A    Number of Children: 0  . Years of Education: N/A   Occupational History  . [employer]    Social History Main Topics  . Smoking status: Never Smoker   . Smokeless tobacco: Never Used  . Alcohol Use: Yes     Comment: beer every other day  . Drug Use: No  . Sexual Activity: Not on file   Other Topics Concern  . Not on file   Social History Narrative  . No narrative on file    Review of systems: Except as described above he denies any chest pain at rest  or with exertion,orthopnea, paroxysmal nocturnal dyspnea, syncope, palpitations, focal neurological deficits, intermittent claudication, lower extremity edema, unexplained weight gain, cough, hemoptysis or wheezing.  The patient also denies abdominal pain, nausea, vomiting, dysphagia, diarrhea, constipation, polyuria, polydipsia, dysuria, hematuria, frequency, urgency, abnormal bleeding or bruising, fever, chills, unexpected weight changes, mood swings, change in skin or hair texture, change in voice quality, auditory or visual problems, allergic reactions or rashes, new musculoskeletal complaints other than usual "aches and pains".   PHYSICAL EXAM BP 120/68  Pulse 80  Ht _0  (1.778 m)  Wt 185 lb 12.8 oz (84.278 kg)  BMI 26.66 kg/m2  General: Alert, oriented x3, no distress Head: no evidence of trauma, PERRL, EOMI, no exophtalmos or  lid lag, no myxedema, no xanthelasma; normal ears, nose and oropharynx Neck: normal jugular venous pulsations and no hepatojugular reflux; brisk carotid pulses without delay and no carotid bruits Chest: clear to auscultation, no signs of consolidation by percussion or palpation, normal fremitus, symmetrical and full respiratory excursions Cardiovascular: normal position and quality of the apical impulse, regular rhythm, normal first and second heart sounds, no murmurs, rubs or gallops Abdomen: no tenderness or distention, no masses by palpation, no abnormal pulsatility or arterial bruits, normal bowel sounds, no hepatosplenomegaly Extremities: no clubbing, cyanosis or edema; does have fairly prominent serpiginous varicose veins in the calves and thighs of both lower extremities; 2+ radial, ulnar and brachial pulses bilaterally; 2+ right femoral, posterior tibial and dorsalis pedis pulses; 2+ left femoral, posterior tibial and dorsalis pedis pulses; no subclavian or femoral bruits Neurological: grossly nonfocal   EKG: NSR  Lipid Panel  No results found for this basename: chol, trig, hdl, cholhdl, vldl, ldlcalc    BMET    Component Value Date/Time   NA 142 01/14/2014 1224   K 4.1 01/14/2014 1224   CL 102 01/14/2014 1224   CO2 30 01/14/2014 1224   GLUCOSE 89 01/14/2014 1224   BUN 12 01/14/2014 1224   CREATININE 0.85 01/14/2014 1224   CREATININE 0.90 12/23/2013 1350   CALCIUM 10.0 01/14/2014 1224   GFRNONAA >90 12/23/2013 1350   GFRAA >90 12/23/2013 1350    ASSESSMENT AND PLAN  With a normal cardiac exam and normal echo and stress test in the past, I doubt that his dyspnea is cardiac. The lack of a consistent association with exercise supports this as well. Pulmonary function testing may be a higher yield evaluation.  He clearly has varicose veins and it is not unreasonable to perform an evaluation of the deep venous system since they are so prominent that her relatively young age.  His muscle  spasms might be related to electrolyte imbalance. We will check magnesium and potassium levels.  Many of his complaints are likely explained by a rather sedentary lifestyle and a lot of "stress" trying to balance work and home responsibilities. I think a regular program of physical exercise would be highly beneficial.  Orders Placed This Encounter  Procedures  . Comprehensive metabolic panel  . Magnesium  . PFT ONLY (MET-TEST)  . Lower Extremity Venous Duplex Bilateral   Meds ordered this encounter  Medications  . cyclobenzaprine (FLEXERIL) 5 MG tablet    Sig: Take 5 mg by mouth 3 (three) times daily as needed.  Marland Kitchen aspirin EC 81 MG tablet    Sig: Take 81 mg by mouth as needed.    Dhanvi Boesen  Sanda Klein, MD, [facility] [phone] office [phone] pager

## 2014-07-18 IMAGING — CR DG CHEST 2V
2 series · 2 of 2 positions shown · non-contrast
Comparison: None

CLINICAL DATA: Shortness of breath, headache, chest pain

EXAM:
CHEST  2 VIEW

[w chest pa]
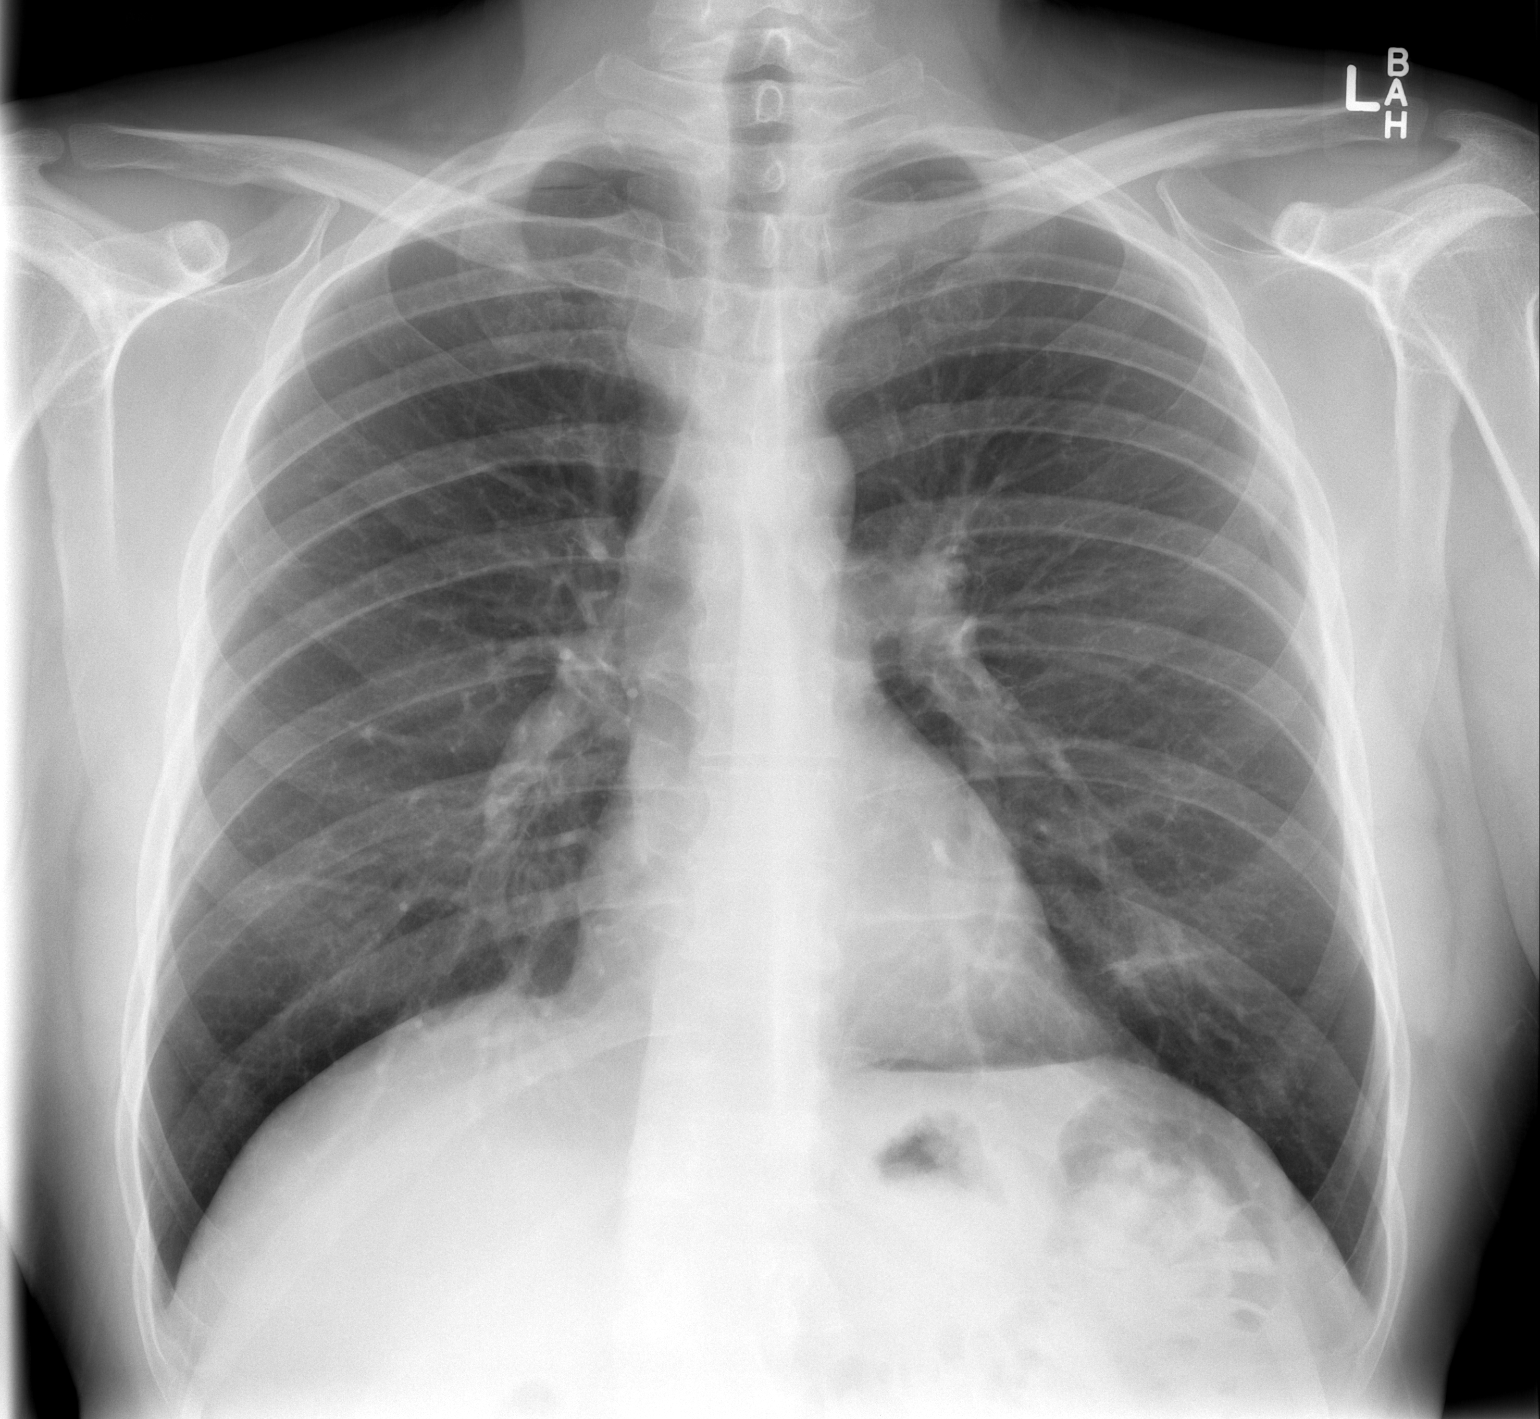

[w chest lat]
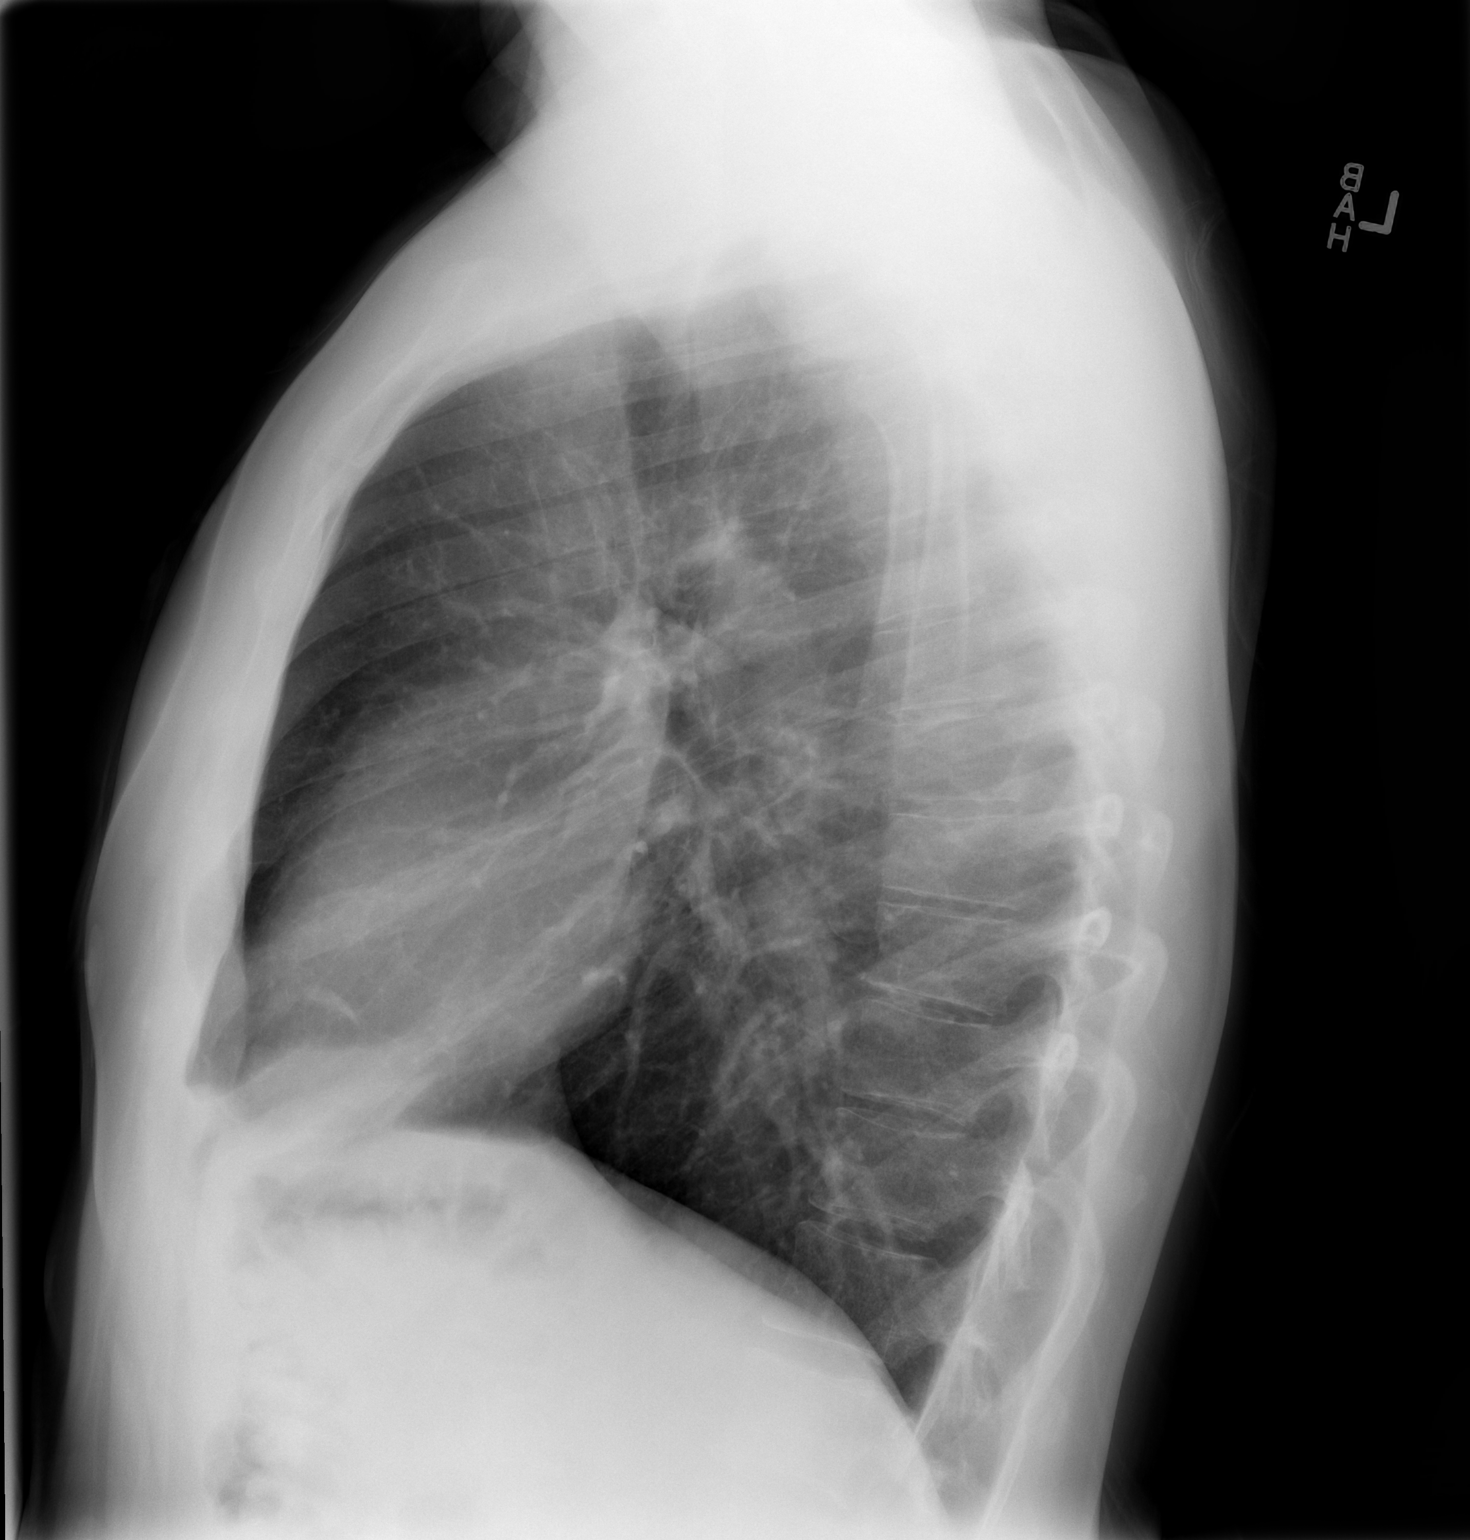

[2 of 2 positions shown; findings below may reference images not displayed]

FINDINGS: Normal heart size, mediastinal contours, and pulmonary vascularity.

Minimal linear scarring or subsegmental atelectasis at lingula.

Lungs otherwise clear.

No pleural effusion or pneumothorax.

Bones unremarkable.
IMPRESSION: Minimal linear atelectasis versus scarring at lingula.

## 2014-10-14 ENCOUNTER — Telehealth: Payer: Self-pay | Admitting: Cardiovascular Disease

## 2014-10-14 DIAGNOSIS — R0602 Shortness of breath: Secondary | ICD-10-CM

## 2014-10-14 DIAGNOSIS — R0600 Dyspnea, unspecified: Secondary | ICD-10-CM

## 2014-10-14 NOTE — Telephone Encounter (Signed)
Pt seen by Dr. Royann Shiversroitoru in March 2015, PFT was suggested at that time, pt called today to see how to go about setting this up. Instructed him that I will refer to [facility] and he will receive a call to get appt scheduled.

## 2014-10-14 NOTE — Telephone Encounter (Signed)
Pt called in stating that he has to have a PFT test done and he would like to know where he should go to get this done. Please call  Thanks

## 2014-10-15 ENCOUNTER — Telehealth (HOSPITAL_COMMUNITY): Payer: Self-pay | Admitting: *Deleted

## 2014-10-15 ENCOUNTER — Encounter (HOSPITAL_COMMUNITY): Payer: Self-pay | Admitting: *Deleted

## 2014-10-15 DIAGNOSIS — R0600 Dyspnea, unspecified: Secondary | ICD-10-CM

## 2014-10-15 NOTE — Telephone Encounter (Signed)
Patient last seen 01/14/14 with c/o dyspnea.  PFT ordered at that time.  Patient opted not to have done at that time due to a high deductible.  Now wants to have it done since he has met his deductible.  Told patient he will need to be re-evaluated if still having DOE.  States he is feeling much better.  Minimal SOB.  Suggested he see his PCP if symptoms reoccur as that would be a lower co-pay.  Patient voiced understanding.

## 2014-10-15 NOTE — Telephone Encounter (Signed)
Pt called stating that he was told that the PFT order is not correct. There is some confusion as to what is supposed to be ordered and where he is supposed to go

## 2014-10-15 NOTE — Telephone Encounter (Signed)
originial order for PFT's was never scheduled.  Order placed for scheduling.

## 2014-11-10 ENCOUNTER — Institutional Professional Consult (permissible substitution): Payer: 59 | Admitting: Pulmonary Disease

## 2015-07-13 ENCOUNTER — Telehealth: Payer: Self-pay | Admitting: Cardiovascular Disease

## 2015-07-13 NOTE — Telephone Encounter (Signed)
Close Encounter
